# Patient Record
Sex: Male | Born: 1965 | Hispanic: Yes | Marital: Married
Health system: Southern US, Community
[De-identification: ages and names within clinical notes are randomized; demographics above are authoritative.]

## PROBLEM LIST (undated history)

## (undated) HISTORY — PX: FUNCTIONAL ENDOSCOPIC SINUS SURGERY: SUR616

---

## 2007-04-28 ENCOUNTER — Ambulatory Visit: Payer: Self-pay | Admitting: Otolaryngology

## 2009-01-04 ENCOUNTER — Ambulatory Visit: Payer: Self-pay | Admitting: Family Medicine

## 2010-09-21 ENCOUNTER — Emergency Department: Payer: Self-pay | Admitting: Emergency Medicine

## 2011-06-07 IMAGING — CR NECK SOFT TISSUES - 1+ VIEW
1 series · 2 of 2 positions shown · non-contrast
Comparison: none

REASON FOR EXAM: foreign body ..pt in WR
COMMENTS:   LMP: (Male)

PROCEDURE:     DXR - DXR SOFT TISSUE NECK  - September 21, 2010 [DATE]
RESULT:     AP and lateral views of the soft tissues of the neck revealed
the airway to be normal in contour and radiographically patent. The
prevertebral soft tissue spaces appear normal. I see no radiopaque foreign
bodies.

[Series 1: view not recorded · 0.17mm/px · 2 of 2 slices shown]
[im 1/2]
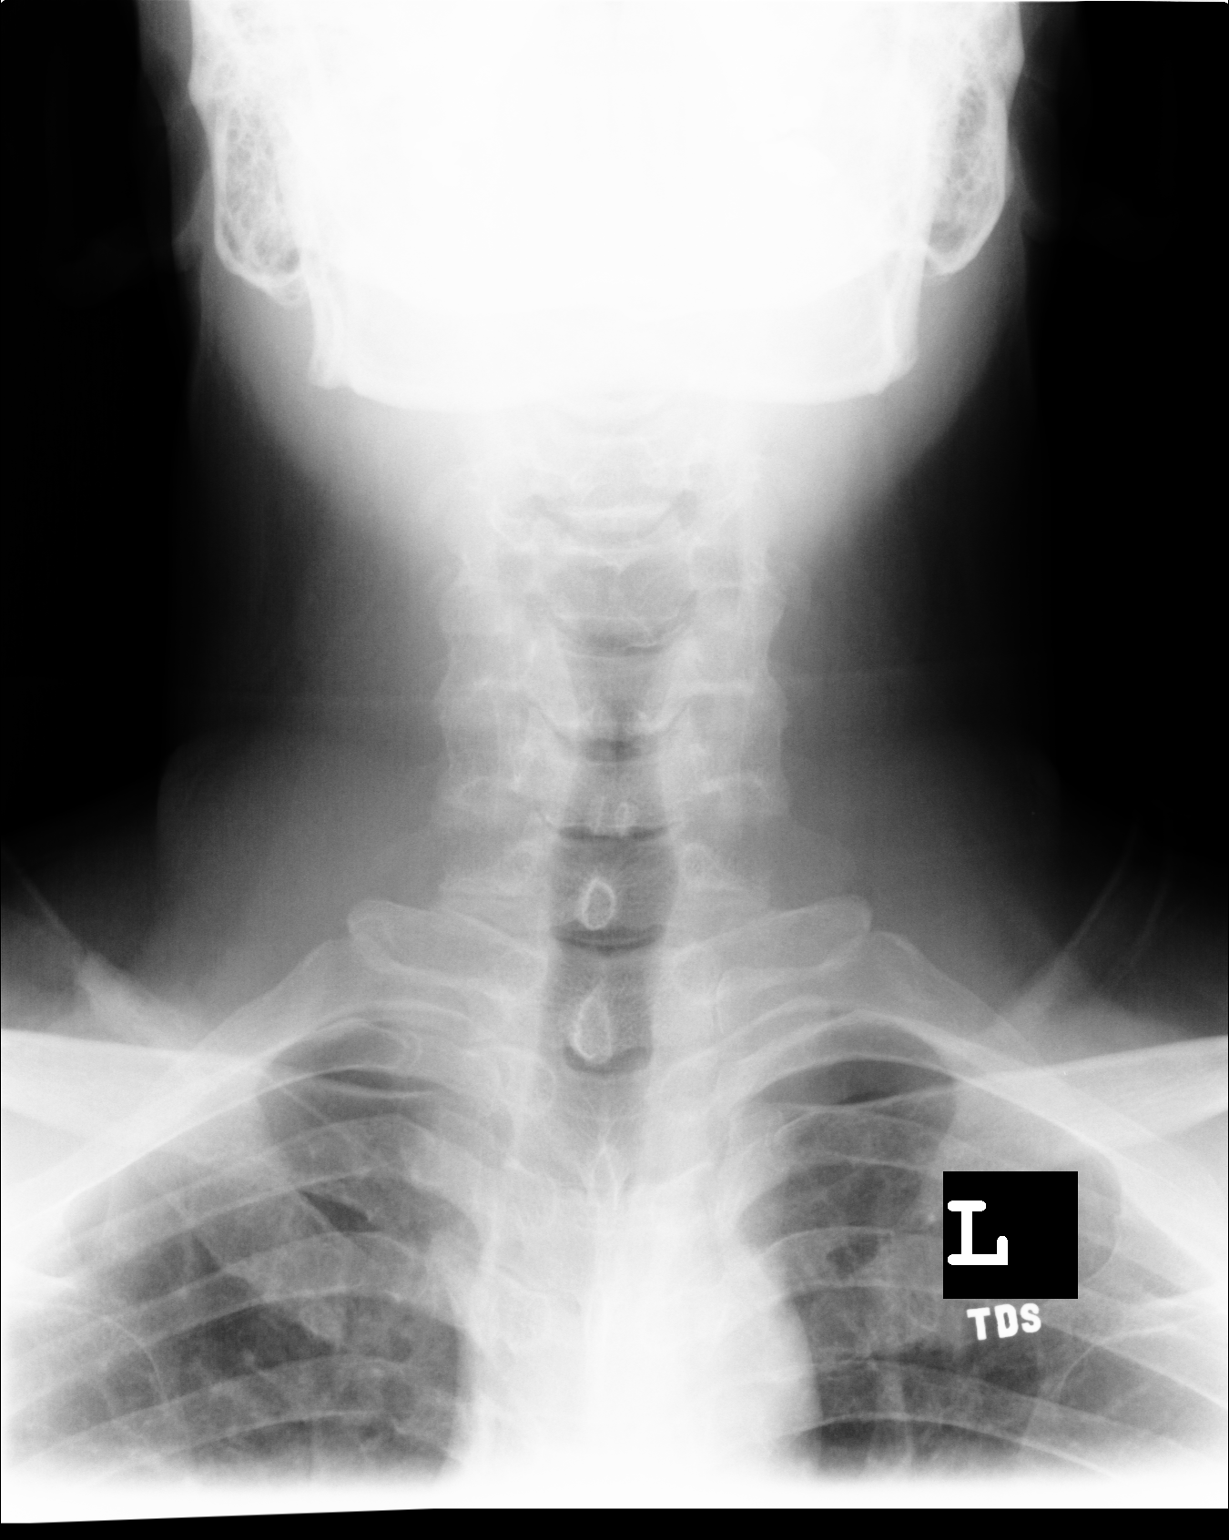
[im 2/2]
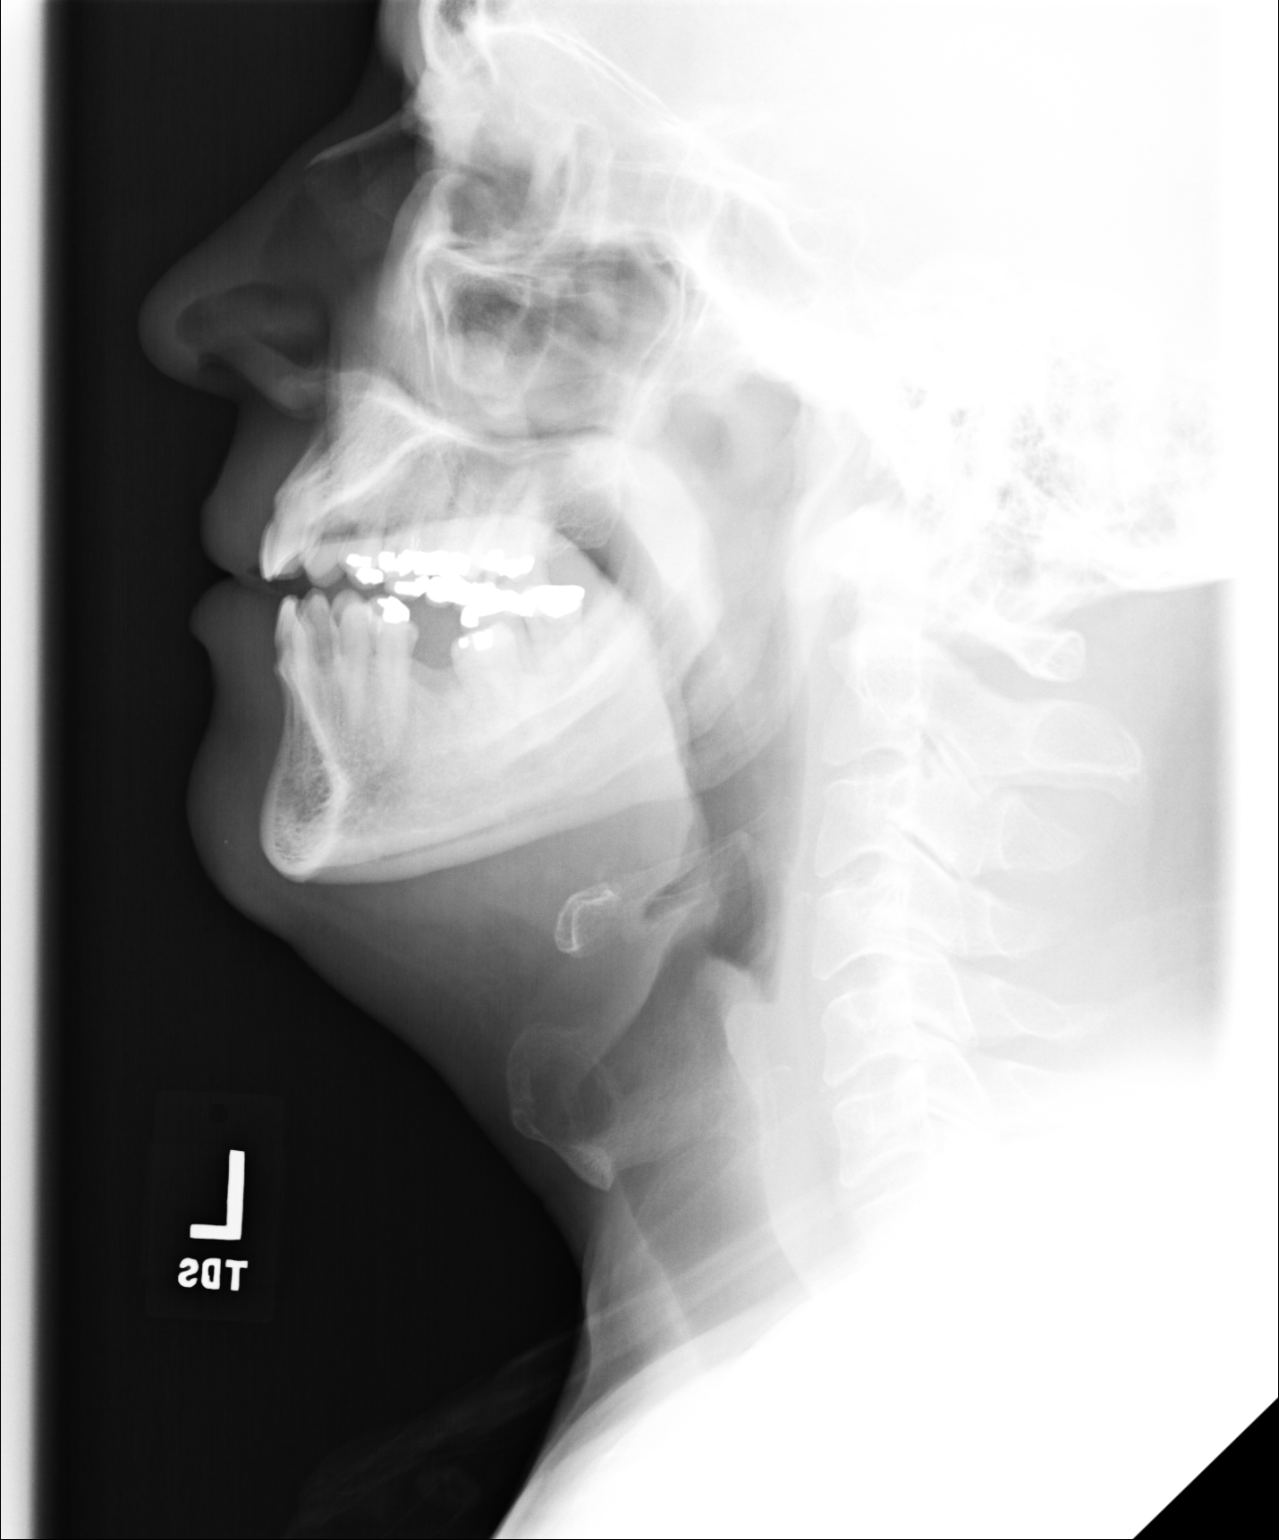

[2 of 2 positions shown; findings below may reference images not displayed]

IMPRESSION: I do not see evidence of a metallic or other radiodense
foreign body within the soft tissues of the neck.

## 2020-02-03 ENCOUNTER — Ambulatory Visit: Payer: Self-pay | Attending: Internal Medicine

## 2022-09-19 DIAGNOSIS — B9689 Other specified bacterial agents as the cause of diseases classified elsewhere: Secondary | ICD-10-CM | POA: Diagnosis not present

## 2022-09-19 DIAGNOSIS — H66003 Acute suppurative otitis media without spontaneous rupture of ear drum, bilateral: Secondary | ICD-10-CM | POA: Diagnosis not present

## 2022-09-19 DIAGNOSIS — J019 Acute sinusitis, unspecified: Secondary | ICD-10-CM | POA: Diagnosis not present

## 2022-10-17 DIAGNOSIS — E6609 Other obesity due to excess calories: Secondary | ICD-10-CM | POA: Diagnosis not present

## 2022-10-17 DIAGNOSIS — Z0000 Encounter for general adult medical examination without abnormal findings: Secondary | ICD-10-CM | POA: Diagnosis not present

## 2022-10-17 DIAGNOSIS — Z6832 Body mass index (BMI) 32.0-32.9, adult: Secondary | ICD-10-CM | POA: Diagnosis not present

## 2022-10-17 DIAGNOSIS — L989 Disorder of the skin and subcutaneous tissue, unspecified: Secondary | ICD-10-CM | POA: Diagnosis not present

## 2022-10-17 DIAGNOSIS — J069 Acute upper respiratory infection, unspecified: Secondary | ICD-10-CM | POA: Diagnosis not present

## 2022-10-24 DIAGNOSIS — Z1211 Encounter for screening for malignant neoplasm of colon: Secondary | ICD-10-CM | POA: Diagnosis not present

## 2022-10-24 DIAGNOSIS — Z1212 Encounter for screening for malignant neoplasm of rectum: Secondary | ICD-10-CM | POA: Diagnosis not present

## 2022-10-24 DIAGNOSIS — Z6833 Body mass index (BMI) 33.0-33.9, adult: Secondary | ICD-10-CM | POA: Diagnosis not present

## 2022-10-24 DIAGNOSIS — Z6832 Body mass index (BMI) 32.0-32.9, adult: Secondary | ICD-10-CM | POA: Diagnosis not present

## 2022-10-24 DIAGNOSIS — Z0000 Encounter for general adult medical examination without abnormal findings: Secondary | ICD-10-CM | POA: Diagnosis not present

## 2022-10-24 DIAGNOSIS — E6609 Other obesity due to excess calories: Secondary | ICD-10-CM | POA: Diagnosis not present

## 2022-10-24 DIAGNOSIS — E782 Mixed hyperlipidemia: Secondary | ICD-10-CM | POA: Diagnosis not present

## 2022-10-24 DIAGNOSIS — Z125 Encounter for screening for malignant neoplasm of prostate: Secondary | ICD-10-CM | POA: Diagnosis not present

## 2023-01-05 DIAGNOSIS — M25561 Pain in right knee: Secondary | ICD-10-CM | POA: Diagnosis not present

## 2023-01-09 DIAGNOSIS — M222X1 Patellofemoral disorders, right knee: Secondary | ICD-10-CM | POA: Diagnosis not present

## 2023-01-09 DIAGNOSIS — M1711 Unilateral primary osteoarthritis, right knee: Secondary | ICD-10-CM | POA: Diagnosis not present

## 2023-01-30 DIAGNOSIS — M1711 Unilateral primary osteoarthritis, right knee: Secondary | ICD-10-CM | POA: Diagnosis not present

## 2023-03-23 DIAGNOSIS — E782 Mixed hyperlipidemia: Secondary | ICD-10-CM | POA: Diagnosis not present

## 2023-03-30 ENCOUNTER — Encounter: Payer: Self-pay | Admitting: *Deleted

## 2023-04-02 ENCOUNTER — Encounter: Payer: Self-pay | Admitting: *Deleted

## 2023-04-03 ENCOUNTER — Encounter
Admission: RE | Disposition: A | Discharge status: Home / Self Care | Payer: Self-pay | Source: Home / Self Care | Attending: Gastroenterology

## 2023-04-03 ENCOUNTER — Ambulatory Visit: Payer: 59 | Admitting: Anesthesiology

## 2023-04-03 ENCOUNTER — Ambulatory Visit
Admission: RE | Admit: 2023-04-03 | Discharge: 2023-04-03 | Disposition: A | Discharge status: Home / Self Care | Payer: 59 | Attending: Gastroenterology | Admitting: Gastroenterology

## 2023-04-03 DIAGNOSIS — Z1211 Encounter for screening for malignant neoplasm of colon: Secondary | ICD-10-CM | POA: Diagnosis not present

## 2023-04-03 DIAGNOSIS — K649 Unspecified hemorrhoids: Secondary | ICD-10-CM | POA: Diagnosis not present

## 2023-04-03 DIAGNOSIS — Z79899 Other long term (current) drug therapy: Secondary | ICD-10-CM | POA: Insufficient documentation

## 2023-04-03 DIAGNOSIS — K640 First degree hemorrhoids: Secondary | ICD-10-CM | POA: Diagnosis not present

## 2023-04-03 HISTORY — PX: COLONOSCOPY WITH PROPOFOL: SHX5780

## 2023-04-03 SURGERY — COLONOSCOPY WITH PROPOFOL
Anesthesia: General

## 2023-04-03 MED ORDER — PROPOFOL 500 MG/50ML IV EMUL
INTRAVENOUS | Status: DC | PRN
  Administered 2023-04-03 (×2): 25 mg via INTRAVENOUS
  Administered 2023-04-03: 50 mg via INTRAVENOUS

## 2023-04-03 MED ORDER — PROPOFOL 10 MG/ML IV BOLUS
INTRAVENOUS | Status: DC | PRN
  Administered 2023-04-03: 75 ug/kg/min via INTRAVENOUS

## 2023-04-03 MED ORDER — PROPOFOL 10 MG/ML IV BOLUS
INTRAVENOUS | Status: AC
  Filled 2023-04-03: qty 40

## 2023-04-03 MED ORDER — SODIUM CHLORIDE 0.9 % IV SOLN
INTRAVENOUS | Status: DC
  Administered 2023-04-03: 20 mL/h via INTRAVENOUS

## 2023-04-03 NOTE — Op Note (Signed)
[facility] Patient Name: Yetta Flockoel Colborn Procedure Date: 04/03/2023 10:42 AM MRN: [id number] Account #: [id number] Date of Birth: [birth year] Admit Type: Outpatient Age: 57 [facility]: [facility] ROOM 3 Gender: Male Note Status: Finalized Instrument Name: Prentice DockerColonoscope [id number] Procedure:             Colonoscopy Indications:           Screening for colorectal malignant neoplasm Providers:             Eather Colasameron Darrol Brandenburg MD, MD Referring MD:          No Local Md, MD (Referring MD) Medicines:             Monitored Anesthesia Care Complications:         No immediate complications. Procedure:             Pre-Anesthesia Assessment:                        - Prior to the procedure, a History and Physical was                         performed, and patient medications and allergies were                         reviewed. The patient is competent. The risks and                         benefits of the procedure and the sedation options and                         risks were discussed with the patient. All questions                         were answered and informed consent was obtained.                         Patient identification and proposed procedure were                         verified by the physician, the nurse, the                         anesthesiologist, the anesthetist and the technician                         in the endoscopy suite. Mental Status Examination:                         alert and oriented. Airway Examination: normal                         oropharyngeal airway and neck mobility. Respiratory                         Examination: clear to auscultation. CV Examination:                         normal. Prophylactic Antibiotics: The patient does not  require prophylactic antibiotics. Prior                         Anticoagulants: The patient has taken no anticoagulant                         or antiplatelet agents. ASA Grade  Assessment: II - A                         patient with mild systemic disease. After reviewing                         the risks and benefits, the patient was deemed in                         satisfactory condition to undergo the procedure. The                         anesthesia plan was to use monitored anesthesia care                         (MAC). Immediately prior to administration of                         medications, the patient was re-assessed for adequacy                         to receive sedatives. The heart rate, respiratory                         rate, oxygen saturations, blood pressure, adequacy of                         pulmonary ventilation, and response to care were                         monitored throughout the procedure. The physical                         status of the patient was re-assessed after the                         procedure.                        After obtaining informed consent, the colonoscope was                         passed under direct vision. Throughout the procedure,                         the patient's blood pressure, pulse, and oxygen                         saturations were monitored continuously. The                         Colonoscope was introduced through the anus and  advanced to the the cecum, identified by appendiceal                         orifice and ileocecal valve. The colonoscopy was                         performed without difficulty. The patient tolerated                         the procedure well. The quality of the bowel                         preparation was good. The ileocecal valve, appendiceal                         orifice, and rectum were photographed. Findings:      The perianal and digital rectal examinations were normal.      Internal hemorrhoids were found during retroflexion. The hemorrhoids       were Grade I (internal hemorrhoids that do not prolapse).      The exam was otherwise  without abnormality on direct and retroflexion       views. Impression:            - Internal hemorrhoids.                        - The examination was otherwise normal on direct and                         retroflexion views.                        - No specimens collected. Recommendation:        - Discharge patient to home.                        - Resume previous diet.                        - Continue present medications.                        - Repeat colonoscopy in 10 years for screening                         purposes.                        - Return to referring physician as previously                         scheduled. Procedure Code(s):     --- Professional ---                        O4599, Colorectal cancer screening; colonoscopy on                         individual not meeting criteria for high risk Diagnosis Code(s):     --- Professional ---  Z12.11, Encounter for screening for malignant neoplasm                         of colon                        K64.0, First degree hemorrhoids CPT copyright 2022 [employer]. All rights reserved. The codes documented in this report are preliminary and upon coder review may  be revised to meet current compliance requirements. Eather Colas MD, MD 04/03/2023 11:07:51 AM Number of Addenda: 0 Note Initiated On: 04/03/2023 10:42 AM Scope Withdrawal Time: 0 hours 6 minutes 53 seconds  Total Procedure Duration: 0 hours 12 minutes 11 seconds  Estimated Blood Loss:  Estimated blood loss: none.      [facility]

## 2023-04-03 NOTE — Interval H&P Note (Signed)
History and Physical Interval Note:  04/03/2023 10:42 AM  Erik Morrison  has presented today for surgery, with the diagnosis of Colon Cancer Screening.  The various methods of treatment have been discussed with the patient and family. After consideration of risks, benefits and other options for treatment, the patient has consented to  Procedure(s) with comments: COLONOSCOPY WITH PROPOFOL (N/A) - PATIENT DECLINES INTERPRETER as a surgical intervention.  The patient's history has been reviewed, patient examined, no change in status, stable for surgery.  I have reviewed the patient's chart and labs.  Questions were answered to the patient's satisfaction.     [name]  Ok to proceed with colonoscopy

## 2023-04-03 NOTE — Transfer of Care (Signed)
Immediate Anesthesia Transfer of Care Note  Patient: Erik Morrison  Procedure(s) Performed: COLONOSCOPY WITH PROPOFOL  Patient Location: PACU  Anesthesia Type:MAC  Level of Consciousness: awake, alert , and oriented  Airway & Oxygen Therapy: Patient Spontanous Breathing  Post-op Assessment: Report given to RN and Post -op Vital signs reviewed and stable  Post vital signs: Reviewed and stable  Last Vitals:  Vitals Value Taken Time  BP 106/60 04/03/23 1108  Temp    Pulse 76 04/03/23 1108  Resp 22 04/03/23 1108  SpO2 99 % 04/03/23 1108  Vitals shown include unvalidated device data.  Last Pain:  Vitals:   04/03/23 0956  TempSrc: Temporal  PainSc: 0-No pain         Complications: No notable events documented.

## 2023-04-03 NOTE — Anesthesia Postprocedure Evaluation (Signed)
Anesthesia Post Note  Patient: Erik Morrison  Procedure(s) Performed: COLONOSCOPY WITH PROPOFOL  Patient location during evaluation: PACU Anesthesia Type: General Level of consciousness: awake and awake and alert Pain management: pain level controlled Vital Signs Assessment: post-procedure vital signs reviewed and stable Respiratory status: spontaneous breathing and nonlabored ventilation Cardiovascular status: blood pressure returned to baseline Anesthetic complications: no   No notable events documented.   Last Vitals:  Vitals:   04/03/23 0956 04/03/23 1106  BP: 122/76 106/60  Pulse: 83 78  Resp: 20 (!) 22  Temp: (!) 35.8 C (!) 36.3 C  SpO2: 100% 99%    Last Pain:  Vitals:   04/03/23 1129  TempSrc:   PainSc: 0-No pain                 VAN STAVEREN,Bari Leib

## 2023-04-03 NOTE — H&P (Signed)
Outpatient short stay form Pre-procedure 04/03/2023  [name], MD  Primary Physician: Pcp, No  Reason for visit:  Screening  History of present illness:    57 y/o gentleman here for index screening colonoscopy. No blood thinners. No family history of GI malignancies. No significant abdominal surgeries.    Current Facility-Administered Medications:    0.9 %  sodium chloride infusion, , Intravenous, Continuous, Narcisa Ganesh, Rossie Muskrat, MD, Last Rate: 20 mL/hr at 04/03/23 1005, 20 mL/hr at 04/03/23 1005  Medications Prior to Admission  Medication Sig Dispense Refill Last Dose   atorvastatin (LIPITOR) 10 MG tablet Take 10 mg by mouth daily.   Past Week   loratadine (CLARITIN) 10 MG tablet Take 10 mg by mouth daily.   Past Week     No Known Allergies   History reviewed. No pertinent past medical history.  Review of systems:  Otherwise negative.    Physical Exam  Gen: Alert, oriented. Appears stated age.  HEENT: PERRLA. Lungs: No respiratory distress CV: RRR Abd: soft, benign, no masses Ext: No edema    Planned procedures: Proceed with colonoscopy. The patient understands the nature of the planned procedure, indications, risks, alternatives and potential complications including but not limited to bleeding, infection, perforation, damage to internal organs and possible oversedation/side effects from anesthesia. The patient agrees and gives consent to proceed.  Please refer to procedure notes for findings, recommendations and patient disposition/instructions.     [name], MD [facility]

## 2023-04-03 NOTE — Anesthesia Preprocedure Evaluation (Signed)
Anesthesia Evaluation  Patient identified by MRN, date of birth, ID band Patient awake    Reviewed: Allergy & Precautions, NPO status , Patient's Chart, lab work & pertinent test results  Airway Mallampati: II  TM Distance: >3 FB Neck ROM: full    Dental  (+) Teeth Intact   Pulmonary neg pulmonary ROS   Pulmonary exam normal breath sounds clear to auscultation       Cardiovascular Exercise Tolerance: Good negative cardio ROS Normal cardiovascular exam Rhythm:Regular Rate:Normal     Neuro/Psych negative neurological ROS  negative psych ROS   GI/Hepatic negative GI ROS, Neg liver ROS,,,  Endo/Other  negative endocrine ROS    Renal/GU negative Renal ROS  negative genitourinary   Musculoskeletal negative musculoskeletal ROS (+)    Abdominal Normal abdominal exam  (+)   Peds negative pediatric ROS (+)  Hematology negative hematology ROS (+)   Anesthesia Other Findings History reviewed. No pertinent past medical history.  Past Surgical History: No date: FUNCTIONAL ENDOSCOPIC SINUS SURGERY     Reproductive/Obstetrics negative OB ROS                             Anesthesia Physical Anesthesia Plan  ASA: 2  Anesthesia Plan: General   Post-op Pain Management:    Induction: Intravenous  PONV Risk Score and Plan: Propofol infusion and TIVA  Airway Management Planned: Natural Airway  Additional Equipment:   Intra-op Plan:   Post-operative Plan:   Informed Consent: I have reviewed the patients History and Physical, chart, labs and discussed the procedure including the risks, benefits and alternatives for the proposed anesthesia with the patient or authorized representative who has indicated his/her understanding and acceptance.     Dental Advisory Given  Plan Discussed with: CRNA and Surgeon  Anesthesia Plan Comments:        Anesthesia Quick Evaluation

## 2023-04-06 ENCOUNTER — Encounter: Payer: Self-pay | Admitting: Gastroenterology

## 2023-05-05 ENCOUNTER — Encounter: Payer: Self-pay | Admitting: Gastroenterology

## 2023-05-08 DIAGNOSIS — J301 Allergic rhinitis due to pollen: Secondary | ICD-10-CM | POA: Diagnosis not present

## 2023-06-08 DIAGNOSIS — L290 Pruritus ani: Secondary | ICD-10-CM | POA: Diagnosis not present

## 2023-09-04 DIAGNOSIS — M7052 Other bursitis of knee, left knee: Secondary | ICD-10-CM | POA: Diagnosis not present

## 2023-09-04 DIAGNOSIS — M25562 Pain in left knee: Secondary | ICD-10-CM | POA: Diagnosis not present

## 2023-09-04 DIAGNOSIS — M25462 Effusion, left knee: Secondary | ICD-10-CM | POA: Diagnosis not present

## 2023-09-14 DIAGNOSIS — M1712 Unilateral primary osteoarthritis, left knee: Secondary | ICD-10-CM | POA: Diagnosis not present

## 2023-09-14 DIAGNOSIS — G8929 Other chronic pain: Secondary | ICD-10-CM | POA: Diagnosis not present

## 2023-09-14 DIAGNOSIS — M25562 Pain in left knee: Secondary | ICD-10-CM | POA: Diagnosis not present

## 2023-10-09 DIAGNOSIS — R5383 Other fatigue: Secondary | ICD-10-CM | POA: Diagnosis not present

## 2023-10-09 DIAGNOSIS — A90 Dengue fever [classical dengue]: Secondary | ICD-10-CM | POA: Diagnosis not present

## 2023-10-09 DIAGNOSIS — R509 Fever, unspecified: Secondary | ICD-10-CM | POA: Diagnosis not present

## 2023-10-09 DIAGNOSIS — R21 Rash and other nonspecific skin eruption: Secondary | ICD-10-CM | POA: Diagnosis not present

## 2023-10-15 DIAGNOSIS — A90 Dengue fever [classical dengue]: Secondary | ICD-10-CM | POA: Diagnosis not present

## 2023-10-15 DIAGNOSIS — Z6832 Body mass index (BMI) 32.0-32.9, adult: Secondary | ICD-10-CM | POA: Diagnosis not present

## 2023-10-15 DIAGNOSIS — Z0000 Encounter for general adult medical examination without abnormal findings: Secondary | ICD-10-CM | POA: Diagnosis not present

## 2023-10-15 DIAGNOSIS — E782 Mixed hyperlipidemia: Secondary | ICD-10-CM | POA: Diagnosis not present

## 2023-10-15 DIAGNOSIS — Z1211 Encounter for screening for malignant neoplasm of colon: Secondary | ICD-10-CM | POA: Diagnosis not present

## 2023-10-15 DIAGNOSIS — E66811 Obesity, class 1: Secondary | ICD-10-CM | POA: Diagnosis not present

## 2023-10-15 DIAGNOSIS — E6609 Other obesity due to excess calories: Secondary | ICD-10-CM | POA: Diagnosis not present

## 2023-10-15 DIAGNOSIS — Z1212 Encounter for screening for malignant neoplasm of rectum: Secondary | ICD-10-CM | POA: Diagnosis not present

## 2023-10-15 DIAGNOSIS — Z125 Encounter for screening for malignant neoplasm of prostate: Secondary | ICD-10-CM | POA: Diagnosis not present

## 2023-10-23 DIAGNOSIS — L290 Pruritus ani: Secondary | ICD-10-CM | POA: Diagnosis not present

## 2023-10-23 DIAGNOSIS — D696 Thrombocytopenia, unspecified: Secondary | ICD-10-CM | POA: Diagnosis not present

## 2023-10-23 DIAGNOSIS — K625 Hemorrhage of anus and rectum: Secondary | ICD-10-CM | POA: Diagnosis not present

## 2023-10-27 DIAGNOSIS — R7989 Other specified abnormal findings of blood chemistry: Secondary | ICD-10-CM | POA: Diagnosis not present

## 2023-10-27 DIAGNOSIS — E6609 Other obesity due to excess calories: Secondary | ICD-10-CM | POA: Diagnosis not present

## 2023-10-27 DIAGNOSIS — D696 Thrombocytopenia, unspecified: Secondary | ICD-10-CM | POA: Diagnosis not present

## 2023-10-27 DIAGNOSIS — E66811 Obesity, class 1: Secondary | ICD-10-CM | POA: Diagnosis not present

## 2023-10-27 DIAGNOSIS — E782 Mixed hyperlipidemia: Secondary | ICD-10-CM | POA: Diagnosis not present

## 2023-10-27 DIAGNOSIS — Z0000 Encounter for general adult medical examination without abnormal findings: Secondary | ICD-10-CM | POA: Diagnosis not present

## 2023-10-27 DIAGNOSIS — Z1331 Encounter for screening for depression: Secondary | ICD-10-CM | POA: Diagnosis not present

## 2023-10-27 DIAGNOSIS — K625 Hemorrhage of anus and rectum: Secondary | ICD-10-CM | POA: Diagnosis not present

## 2023-10-27 DIAGNOSIS — Z6832 Body mass index (BMI) 32.0-32.9, adult: Secondary | ICD-10-CM | POA: Diagnosis not present

## 2023-11-20 DIAGNOSIS — J069 Acute upper respiratory infection, unspecified: Secondary | ICD-10-CM | POA: Diagnosis not present

## 2024-09-29 DIAGNOSIS — M25462 Effusion, left knee: Secondary | ICD-10-CM | POA: Diagnosis not present

## 2024-09-29 DIAGNOSIS — M1712 Unilateral primary osteoarthritis, left knee: Secondary | ICD-10-CM | POA: Diagnosis not present

## 2024-10-28 DIAGNOSIS — E782 Mixed hyperlipidemia: Secondary | ICD-10-CM | POA: Diagnosis not present

## 2024-10-28 DIAGNOSIS — Z0000 Encounter for general adult medical examination without abnormal findings: Secondary | ICD-10-CM | POA: Diagnosis not present

## 2024-10-28 DIAGNOSIS — E6609 Other obesity due to excess calories: Secondary | ICD-10-CM | POA: Diagnosis not present

## 2024-10-28 DIAGNOSIS — Z6832 Body mass index (BMI) 32.0-32.9, adult: Secondary | ICD-10-CM | POA: Diagnosis not present

## 2024-10-28 DIAGNOSIS — E66811 Obesity, class 1: Secondary | ICD-10-CM | POA: Diagnosis not present

## 2024-10-28 DIAGNOSIS — R7989 Other specified abnormal findings of blood chemistry: Secondary | ICD-10-CM | POA: Diagnosis not present

## 2024-10-28 DIAGNOSIS — I8392 Asymptomatic varicose veins of left lower extremity: Secondary | ICD-10-CM | POA: Diagnosis not present

## 2024-10-28 DIAGNOSIS — Z1331 Encounter for screening for depression: Secondary | ICD-10-CM | POA: Diagnosis not present

## 2024-10-28 DIAGNOSIS — Z125 Encounter for screening for malignant neoplasm of prostate: Secondary | ICD-10-CM | POA: Diagnosis not present

## 2024-11-04 ENCOUNTER — Other Ambulatory Visit: Payer: Self-pay | Admitting: Orthopedic Surgery

## 2024-11-04 DIAGNOSIS — M25562 Pain in left knee: Secondary | ICD-10-CM | POA: Diagnosis not present

## 2024-11-04 DIAGNOSIS — M25462 Effusion, left knee: Secondary | ICD-10-CM

## 2024-11-04 DIAGNOSIS — M1712 Unilateral primary osteoarthritis, left knee: Secondary | ICD-10-CM | POA: Diagnosis not present

## 2024-11-07 ENCOUNTER — Encounter: Payer: Self-pay | Admitting: Orthopedic Surgery

## 2024-11-12 ENCOUNTER — Ambulatory Visit
Admission: RE | Admit: 2024-11-12 | Discharge: 2024-11-12 | Disposition: A | Discharge status: Home / Self Care | Source: Ambulatory Visit | Attending: Orthopedic Surgery | Admitting: Orthopedic Surgery

## 2024-11-12 DIAGNOSIS — M25462 Effusion, left knee: Secondary | ICD-10-CM

## 2024-11-12 DIAGNOSIS — M25562 Pain in left knee: Secondary | ICD-10-CM

## 2024-11-12 DIAGNOSIS — M23322 Other meniscus derangements, posterior horn of medial meniscus, left knee: Secondary | ICD-10-CM | POA: Diagnosis not present

## 2024-11-23 ENCOUNTER — Other Ambulatory Visit (INDEPENDENT_AMBULATORY_CARE_PROVIDER_SITE_OTHER): Payer: Self-pay | Admitting: Nurse Practitioner

## 2024-11-23 DIAGNOSIS — I8392 Asymptomatic varicose veins of left lower extremity: Secondary | ICD-10-CM

## 2024-11-30 ENCOUNTER — Encounter (INDEPENDENT_AMBULATORY_CARE_PROVIDER_SITE_OTHER): Payer: Self-pay | Admitting: Nurse Practitioner

## 2024-11-30 ENCOUNTER — Encounter (INDEPENDENT_AMBULATORY_CARE_PROVIDER_SITE_OTHER): Payer: Self-pay

## 2024-12-12 DIAGNOSIS — J069 Acute upper respiratory infection, unspecified: Secondary | ICD-10-CM | POA: Diagnosis not present

## 2025-03-01 ENCOUNTER — Ambulatory Visit: Admitting: Family Medicine
